# Patient Record
Sex: Male | Born: 1985 | Race: White | Hispanic: No | Marital: Single | Smoking: Never smoker
Health system: Southern US, Community
[De-identification: ages and names within clinical notes are randomized; demographics above are authoritative.]

---

## 2011-11-12 ENCOUNTER — Encounter (HOSPITAL_COMMUNITY): Payer: Self-pay | Admitting: *Deleted

## 2011-11-12 ENCOUNTER — Emergency Department (HOSPITAL_COMMUNITY)
Admission: EM | Admit: 2011-11-12 | Discharge: 2011-11-12 | Disposition: A | Discharge status: Home / Self Care | Payer: BC Managed Care – PPO | Attending: Emergency Medicine | Admitting: Emergency Medicine

## 2011-11-12 DIAGNOSIS — R61 Generalized hyperhidrosis: Secondary | ICD-10-CM | POA: Insufficient documentation

## 2011-11-12 DIAGNOSIS — R002 Palpitations: Secondary | ICD-10-CM

## 2011-11-12 DIAGNOSIS — R0602 Shortness of breath: Secondary | ICD-10-CM | POA: Insufficient documentation

## 2011-11-12 LAB — CBC WITH DIFFERENTIAL/PLATELET
Basophils Relative: 0 % (ref 0–1)
Eosinophils Absolute: 0.1 10*3/uL (ref 0.0–0.7)
Eosinophils Relative: 1 % (ref 0–5)
Lymphocytes Relative: 30 % (ref 12–46)
Lymphs Abs: 2.6 10*3/uL (ref 0.7–4.0)
Monocytes Absolute: 0.3 10*3/uL (ref 0.1–1.0)
Monocytes Relative: 4 % (ref 3–12)
Neutro Abs: 5.7 10*3/uL (ref 1.7–7.7)
Platelets: 212 10*3/uL (ref 150–400)
RBC: 5.93 MIL/uL — ABNORMAL HIGH (ref 4.22–5.81)
RDW: 15.7 % — ABNORMAL HIGH (ref 11.5–15.5)
WBC: 8.7 10*3/uL (ref 4.0–10.5)

## 2011-11-12 LAB — BASIC METABOLIC PANEL
BUN: 23 mg/dL (ref 6–23)
Calcium: 9.4 mg/dL (ref 8.4–10.5)
Creatinine, Ser: 0.92 mg/dL (ref 0.50–1.35)
GFR calc Af Amer: >90 mL/min (ref >90)
GFR calc non Af Amer: >90 mL/min (ref >90)
Glucose, Bld: 95 mg/dL (ref 70–99)

## 2011-11-12 NOTE — ED Notes (Signed)
Pt states he was waken from sleep with heart racing and shortness of breath

## 2011-11-12 NOTE — ED Provider Notes (Signed)
History     CSN: [id number]  Arrival date & time 11/12/11  0415   First MD Initiated Contact with Patient 11/12/11 [phone]      Chief Complaint  Patient presents with  . Palpitations  . Shortness of Breath    (Consider location/radiation/quality/duration/timing/severity/associated sxs/prior treatment) Patient is a 26 y.o. male presenting with palpitations. The history is provided by the patient.  Palpitations  Associated symptoms include diaphoresis. Pertinent negatives include no fever, no chest pain, no chest pressure, no abdominal pain, no nausea, no vomiting, no headaches, no cough and no shortness of breath.  pt say she has been awakened with profuse diaphoresis and a rapid heart beat for a few days.   He denies pain, cough, sob, n/v/d/f/c.  He says his voice has changed but denies st.  He has not had this before.  He drinks coffee occasionally.  He uses no drugs or medicines.  He denies smoking.   No past medical history on file.  History reviewed. No pertinent past surgical history.  History reviewed. No pertinent family history.  History  Substance Use Topics  . Smoking status: Not on file  . Smokeless tobacco: Not on file  . Alcohol Use: Yes      Review of Systems  Constitutional: Positive for diaphoresis. Negative for fever and chills.  HENT: Positive for voice change. Negative for congestion, sore throat, trouble swallowing, neck pain and neck stiffness.   Respiratory: Negative for cough and shortness of breath.   Cardiovascular: Positive for palpitations. Negative for chest pain.  Gastrointestinal: Negative for nausea, vomiting, abdominal pain and diarrhea.  Skin: Negative for pallor and rash.  Neurological: Negative for headaches.  Hematological: Does not bruise/bleed easily.  Psychiatric/Behavioral: Negative for confusion.  All other systems reviewed and are negative.    Allergies  Review of patient's allergies indicates no known allergies.  Home  Medications   Current Outpatient Rx  Name Route Sig Dispense Refill  . ASPIRIN EC 325 MG PO TBEC Oral Take 650 mg by mouth once.      BP 120/70  Pulse 60  Temp 98.7 F (37.1 C) (Oral)  Resp 14  SpO2 100%  Physical Exam  Vitals reviewed. Constitutional: He is oriented to person, place, and time. He appears well-developed and well-nourished. No distress.  HENT:  Head: Normocephalic and atraumatic.  Mouth/Throat: Oropharynx is clear and moist. No oropharyngeal exudate.  Eyes: Conjunctivae and EOM are normal.  Neck: Normal range of motion. Neck supple.  Cardiovascular: Normal rate and intact distal pulses.   No murmur heard. Pulmonary/Chest: Effort normal. No respiratory distress.  Abdominal: Soft. Bowel sounds are normal. There is no tenderness.  Musculoskeletal: Normal range of motion. He exhibits no edema.  Neurological: He is alert and oriented to person, place, and time.  Skin: Skin is warm and dry.  Psychiatric: He has a normal mood and affect. Thought content normal.    ED Course  Procedures (including critical care time) Idiopathic tachycardia and sweating by hx. Nl ecg.  asx except "voice change" . Nl pe.  Will check labs.  Expect that he needs to see cards for monitor.     Labs Reviewed  CBC WITH DIFFERENTIAL  BASIC METABOLIC PANEL   No results found.   No diagnosis found.  ECG Normal sinus rhythm, heart rate 64 beats per minute. Normal axis. RSR prime consistent with right ventricular conduction or RVH. Normal ST and T waves  normal sinus rhythm heartbeat or ischemic changes   MDM  Palpitations. No signs of dysrrhythmia. No electrolyte abnly or anemia.   Normal hr.  No signs severe illness       Cheri Guppy, MD 11/12/11 [phone]

## 2011-11-12 NOTE — ED Notes (Addendum)
Pt states that he was awoken from sleep at 0300 tonight with his heart racing, SOB, shaky, and sweaty. Pt says the symptoms lasted ~ 30 minutes and that this happened on Monday night last week also. Pt denies any night terrors, bad dreams, caffeine intake before bed and any changes to his usual bedtime routine. No hx of heart or respiratory problems.

## 2015-11-15 DIAGNOSIS — Z8349 Family history of other endocrine, nutritional and metabolic diseases: Secondary | ICD-10-CM | POA: Diagnosis not present

## 2015-11-15 DIAGNOSIS — L309 Dermatitis, unspecified: Secondary | ICD-10-CM | POA: Diagnosis not present

## 2015-11-15 DIAGNOSIS — R5383 Other fatigue: Secondary | ICD-10-CM | POA: Diagnosis not present

## 2015-11-15 DIAGNOSIS — R1032 Left lower quadrant pain: Secondary | ICD-10-CM | POA: Diagnosis not present

## 2015-11-22 DIAGNOSIS — R1032 Left lower quadrant pain: Secondary | ICD-10-CM | POA: Diagnosis not present

## 2015-11-29 DIAGNOSIS — R1032 Left lower quadrant pain: Secondary | ICD-10-CM | POA: Diagnosis not present

## 2015-12-04 ENCOUNTER — Other Ambulatory Visit: Payer: Self-pay | Admitting: Surgery

## 2015-12-04 DIAGNOSIS — R1032 Left lower quadrant pain: Secondary | ICD-10-CM

## 2015-12-10 ENCOUNTER — Other Ambulatory Visit: Payer: Self-pay

## 2015-12-13 ENCOUNTER — Ambulatory Visit
Admission: RE | Admit: 2015-12-13 | Discharge: 2015-12-13 | Disposition: A | Discharge status: Home / Self Care | Payer: BLUE CROSS/BLUE SHIELD | Source: Ambulatory Visit | Attending: Surgery | Admitting: Surgery

## 2015-12-13 DIAGNOSIS — R1030 Lower abdominal pain, unspecified: Secondary | ICD-10-CM | POA: Diagnosis not present

## 2015-12-13 DIAGNOSIS — R1032 Left lower quadrant pain: Secondary | ICD-10-CM

## 2015-12-13 MED ORDER — IOPAMIDOL (ISOVUE-300) INJECTION 61%
100.0000 mL | Freq: Once | INTRAVENOUS | Status: AC | PRN
  Administered 2015-12-13: 100 mL via INTRAVENOUS

## 2015-12-20 ENCOUNTER — Other Ambulatory Visit: Payer: Self-pay | Admitting: Surgery

## 2015-12-20 DIAGNOSIS — R1032 Left lower quadrant pain: Secondary | ICD-10-CM

## 2015-12-24 ENCOUNTER — Other Ambulatory Visit: Payer: BLUE CROSS/BLUE SHIELD

## 2015-12-27 ENCOUNTER — Ambulatory Visit
Admission: RE | Admit: 2015-12-27 | Discharge: 2015-12-27 | Disposition: A | Discharge status: Home / Self Care | Payer: BLUE CROSS/BLUE SHIELD | Source: Ambulatory Visit | Attending: Surgery | Admitting: Surgery

## 2015-12-27 DIAGNOSIS — R1032 Left lower quadrant pain: Secondary | ICD-10-CM

## 2015-12-27 DIAGNOSIS — R1030 Lower abdominal pain, unspecified: Secondary | ICD-10-CM | POA: Diagnosis not present

## 2015-12-28 ENCOUNTER — Other Ambulatory Visit: Payer: BLUE CROSS/BLUE SHIELD

## 2016-01-07 DIAGNOSIS — R102 Pelvic and perineal pain: Secondary | ICD-10-CM | POA: Diagnosis not present

## 2016-01-18 DIAGNOSIS — H00034 Abscess of left upper eyelid: Secondary | ICD-10-CM | POA: Diagnosis not present

## 2016-01-19 DIAGNOSIS — H0014 Chalazion left upper eyelid: Secondary | ICD-10-CM | POA: Diagnosis not present

## 2016-02-23 DIAGNOSIS — A630 Anogenital (venereal) warts: Secondary | ICD-10-CM | POA: Diagnosis not present

## 2016-02-28 DIAGNOSIS — R102 Pelvic and perineal pain: Secondary | ICD-10-CM | POA: Diagnosis not present

## 2016-04-05 DIAGNOSIS — D649 Anemia, unspecified: Secondary | ICD-10-CM | POA: Diagnosis not present

## 2016-04-05 DIAGNOSIS — A630 Anogenital (venereal) warts: Secondary | ICD-10-CM | POA: Diagnosis not present

## 2016-08-11 DIAGNOSIS — R42 Dizziness and giddiness: Secondary | ICD-10-CM | POA: Diagnosis not present

## 2016-08-11 DIAGNOSIS — D649 Anemia, unspecified: Secondary | ICD-10-CM | POA: Diagnosis not present

## 2016-08-11 DIAGNOSIS — R238 Other skin changes: Secondary | ICD-10-CM | POA: Diagnosis not present

## 2016-08-11 DIAGNOSIS — H538 Other visual disturbances: Secondary | ICD-10-CM | POA: Diagnosis not present

## 2016-10-15 IMAGING — CT CT PELVIS W/ CM
1 series · 15 of 32 positions shown, 19 images · IV contrast (APPLIED)
Comparison: None.

CLINICAL DATA: Left groin pain.

EXAM:
CT PELVIS WITH CONTRAST
TECHNIQUE: Multidetector CT imaging of the pelvis was performed using the
standard protocol following the bolus administration of intravenous
contrast.
CONTRAST:  100mL 9LKRV1-1PP IOPAMIDOL (9LKRV1-1PP) INJECTION 61%

[Series 2: routine pelvis w/cm · axial · 0.66mm/px · z∈[-422,-142]mm · 15 of 63 slices shown, 19 images]
[im 5/63  soft-tissue]
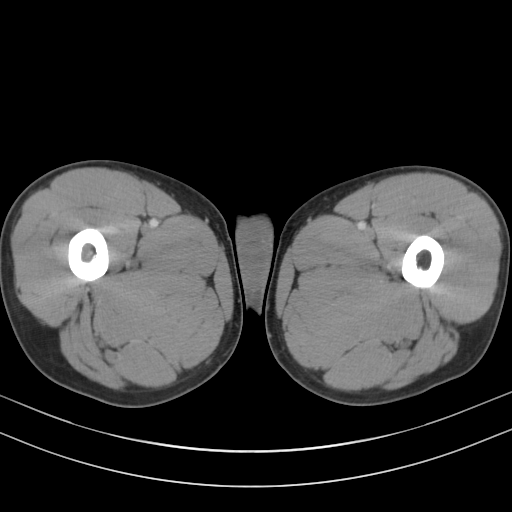
[im 5/63  bone]
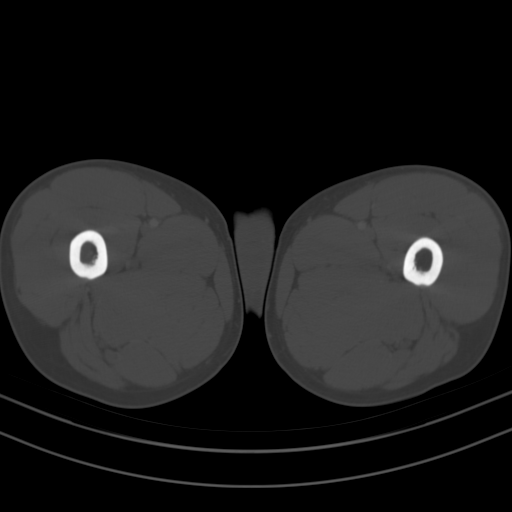
[im 9/63  soft-tissue]
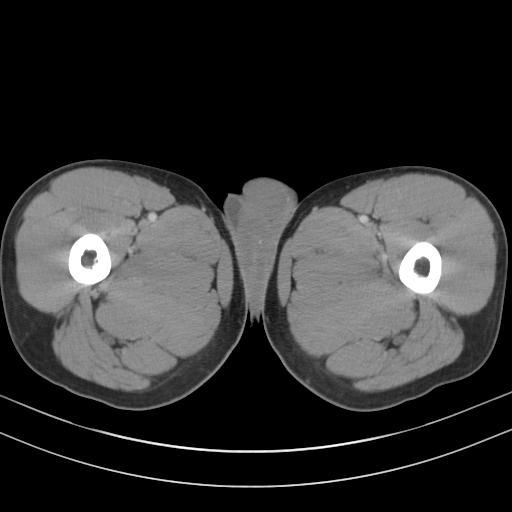
[im 13/63  soft-tissue]
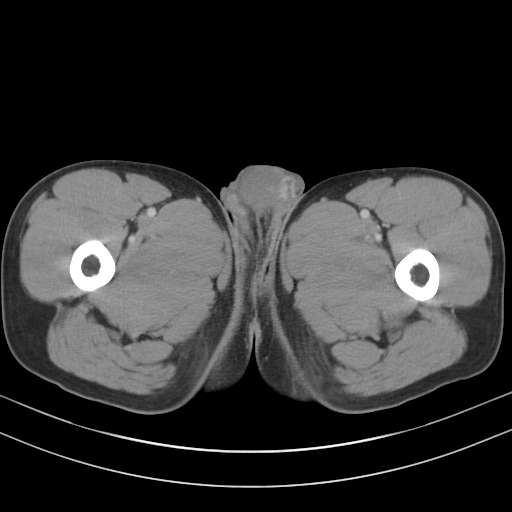
[im 19/63  soft-tissue]
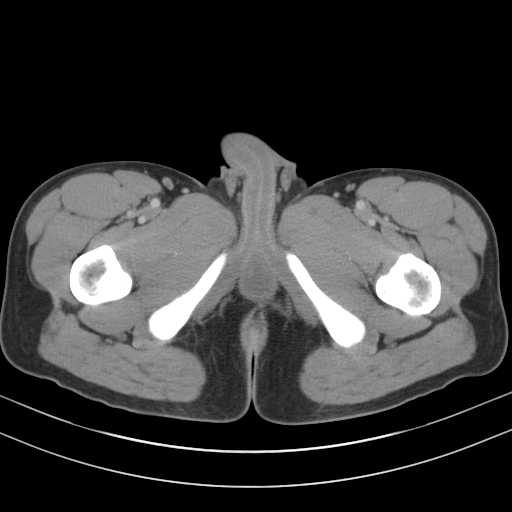
[im 23/63  soft-tissue]
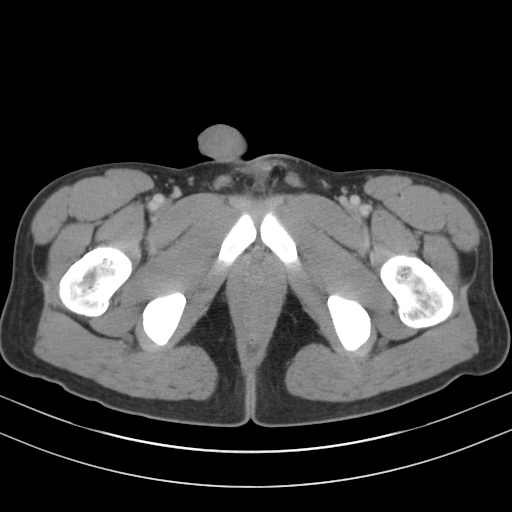
[im 27/63  soft-tissue]
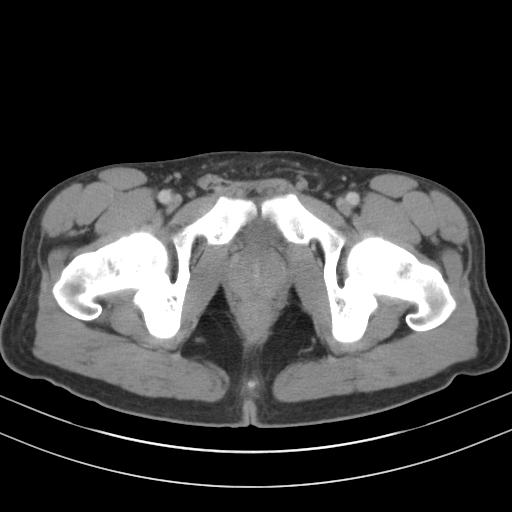
[im 33/63  soft-tissue]
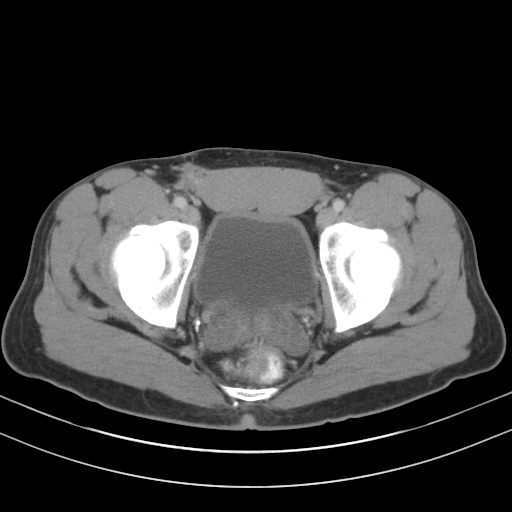
[im 37/63  soft-tissue]
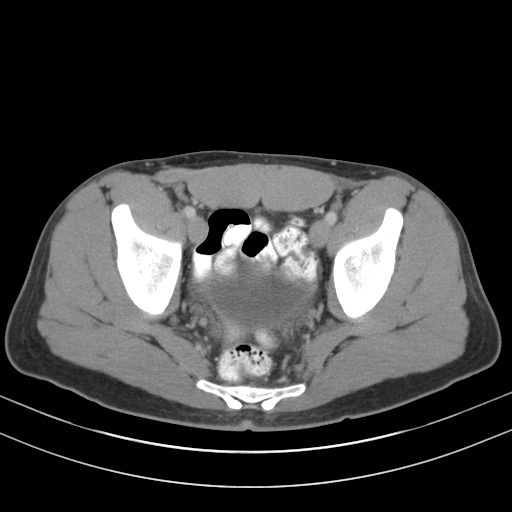
[im 41/63  soft-tissue]
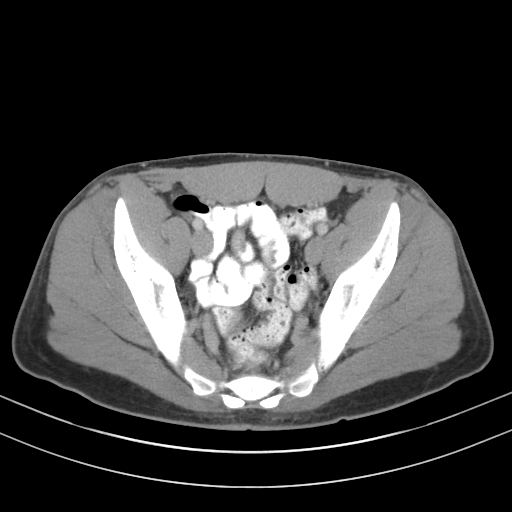
[im 41/63  bone]
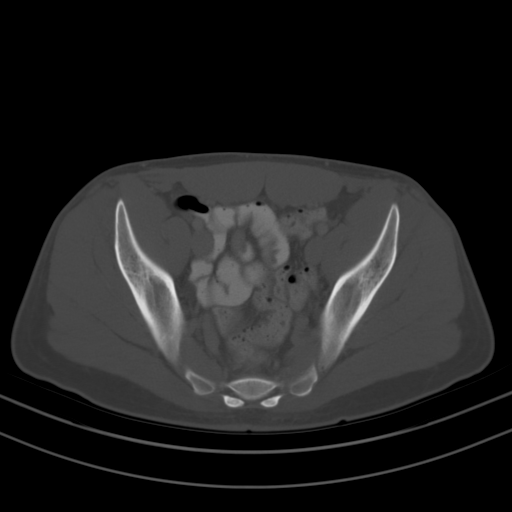
[im 45/63  soft-tissue]
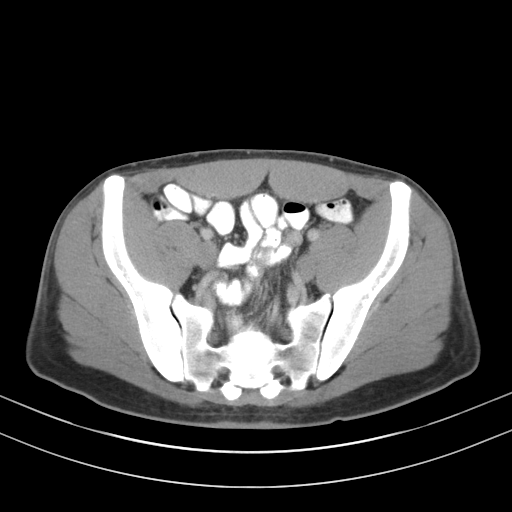
[im 51/63  soft-tissue]
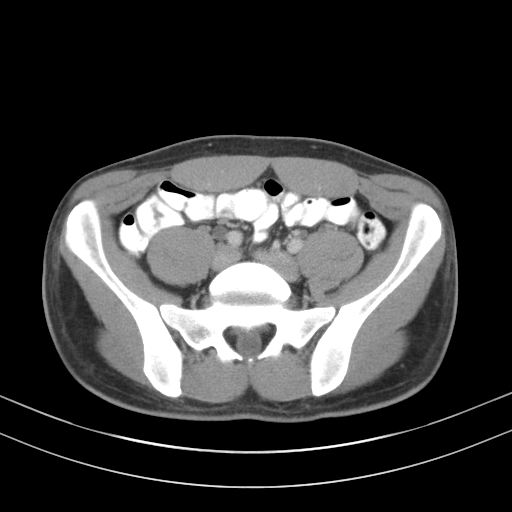
[im 55/63  soft-tissue]
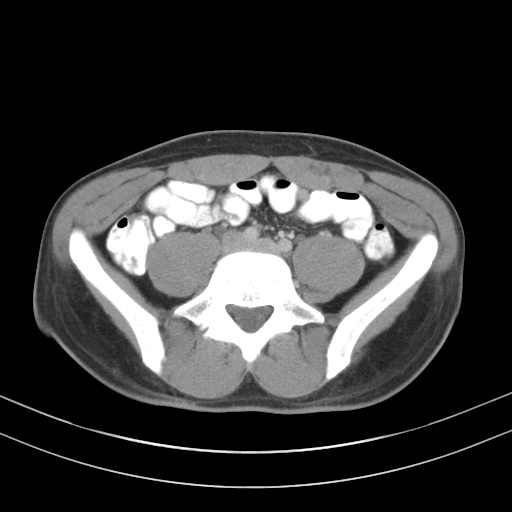
[im 55/63  lung]
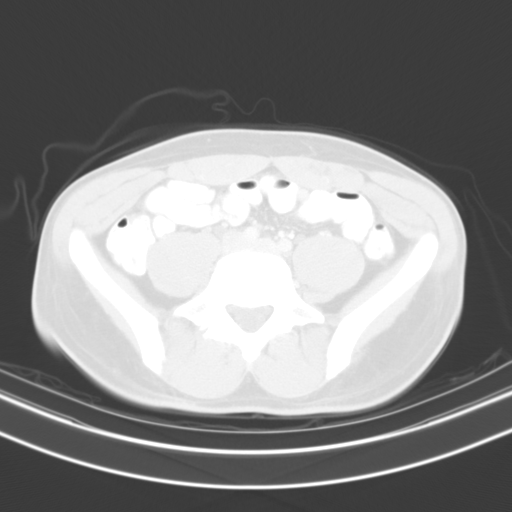
[im 57/63  lung]
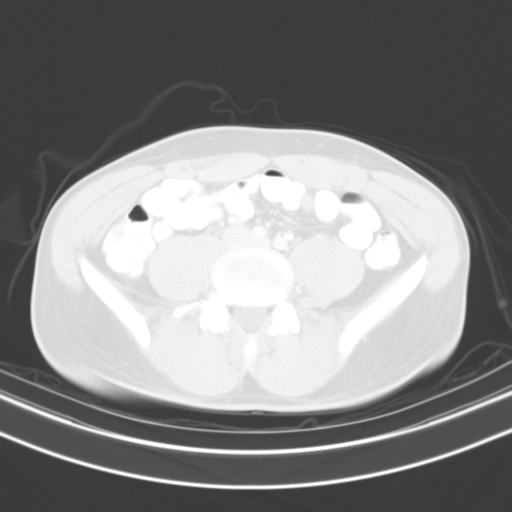
[im 59/63  soft-tissue]
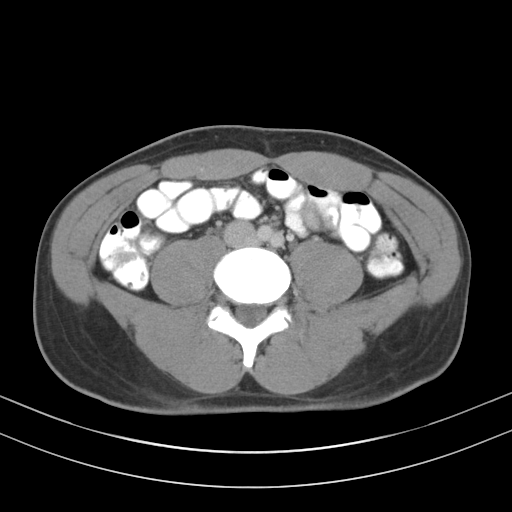
[im 59/63  lung]
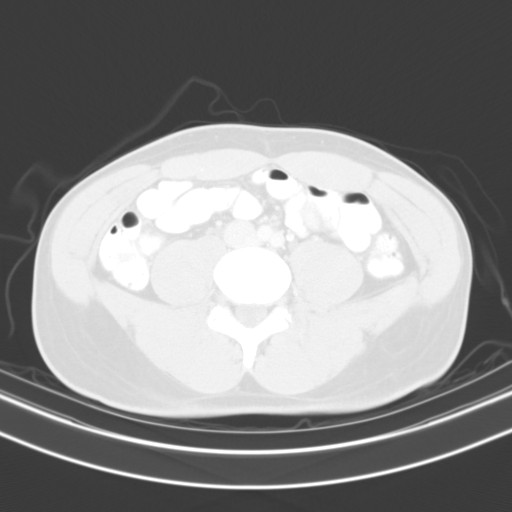
[im 61/63  lung]
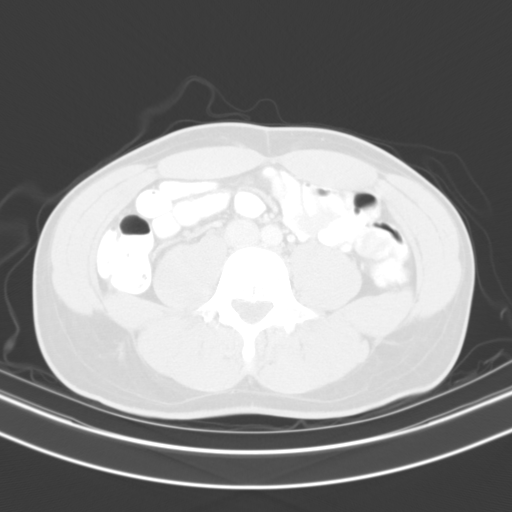

[15 of 32 positions shown; findings below may reference images not displayed]

FINDINGS: No free air or free fluid. Mild prominence of the bladder wall
anteriorly may be due to poor distention. There is no adjacent
stranding to suggest active inflammation. No masses are seen in the
bladder on limited images. The prostate and seminal vesicles are
normal. The patient is status post a right inguinal hernia repair
with no evidence of recurrence. No significant left inguinal hernia.
No adenopathy or mass in the pelvis. The appendix is not seen but
there is no secondary evidence of appendicitis. Pelvic loops of
bowel are within normal limits.

Mildly prominent vessels in the scrotum bilaterally, left greater
than right. No significant bony abnormalities.
IMPRESSION: 1. Prominent vessels in the scrotum are nonspecific and poorly
evaluated with CT imaging but varicoceles are not excluded. An
ultrasound could better evaluate.
2. No other cause for left groin pain identified.

## 2016-10-29 IMAGING — US US SCROTUM
1 series · 13 of 25 positions shown · non-contrast
Comparison: CT [DATE]

CLINICAL DATA: Left inguinal pain since June 2015. Prior right
inguinal hernia repair.

EXAM:
ULTRASOUND OF SCROTUM
SCROTAL DOPPLER
TECHNIQUE: Complete ultrasound examination of the testicles, epididymis, and
other scrotal structures was performed. Doppler interrogation of the
testicles performed.

[Series 1: us scrotum · 0.13mm/px · 13 of 46 slices shown]
[im 1/46]
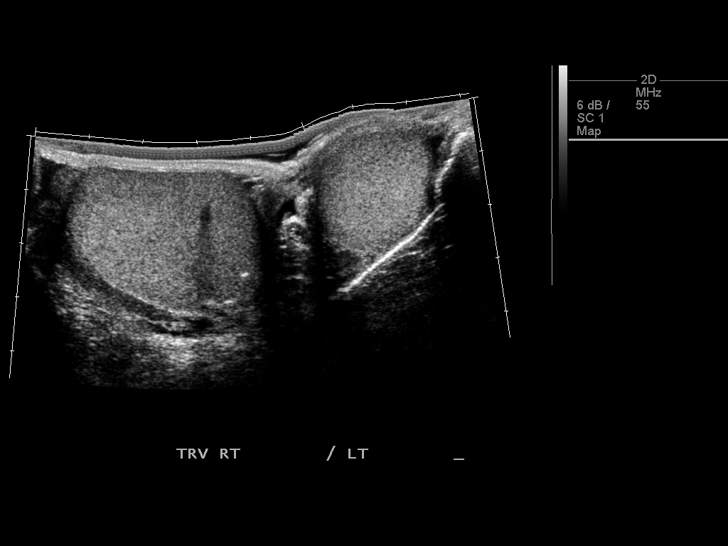
[im 4/46]
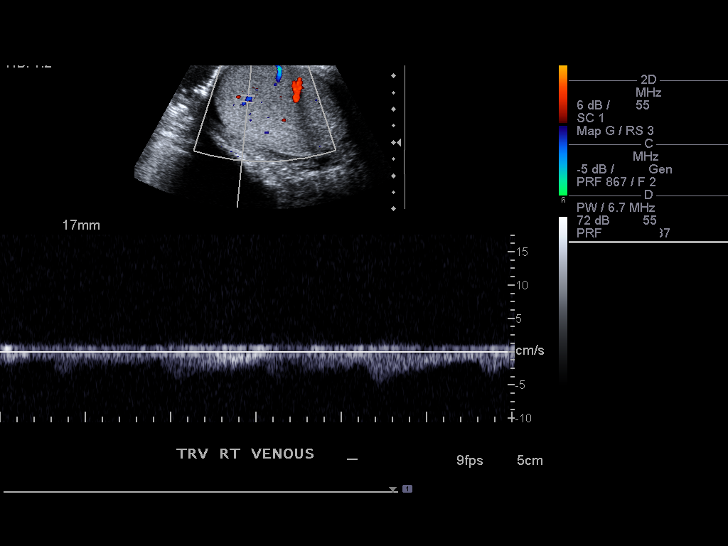
[im 8/46]
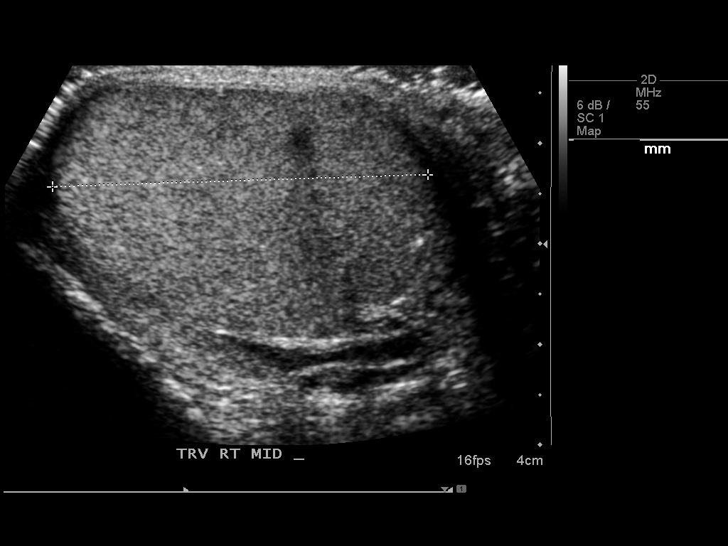
[im 12/46]
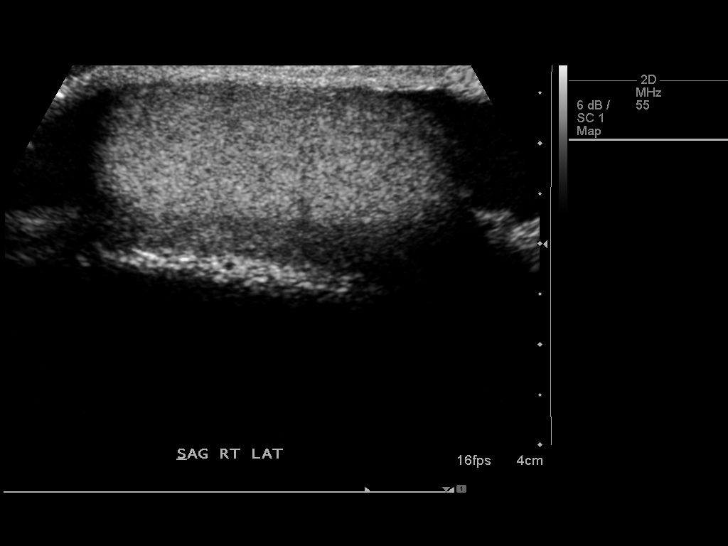
[im 16/46]
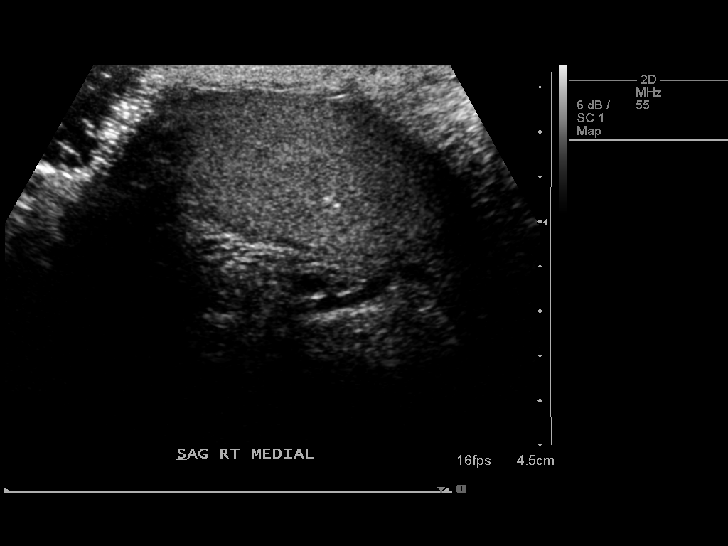
[im 19/46]
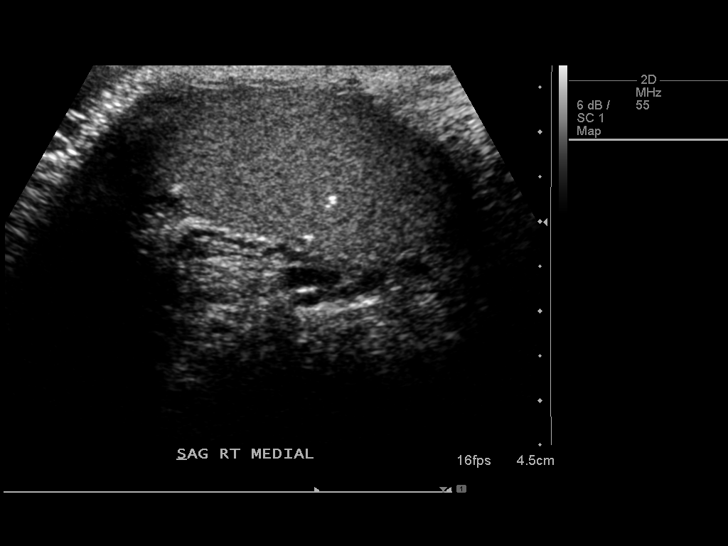
[im 23/46]
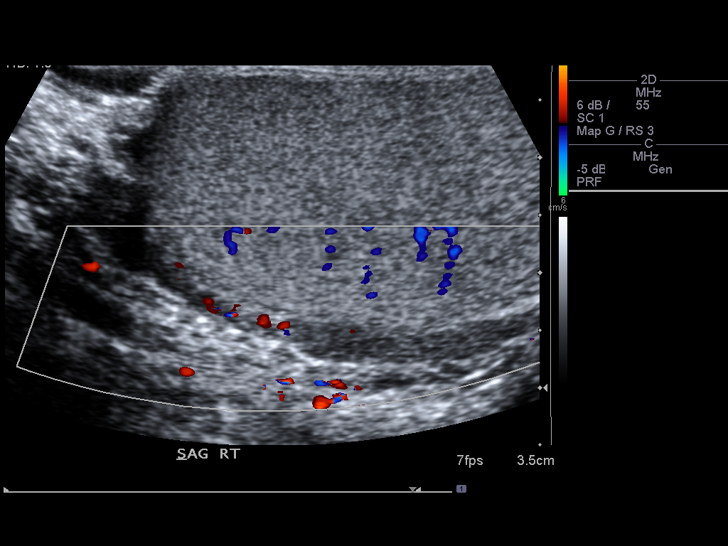
[im 27/46]
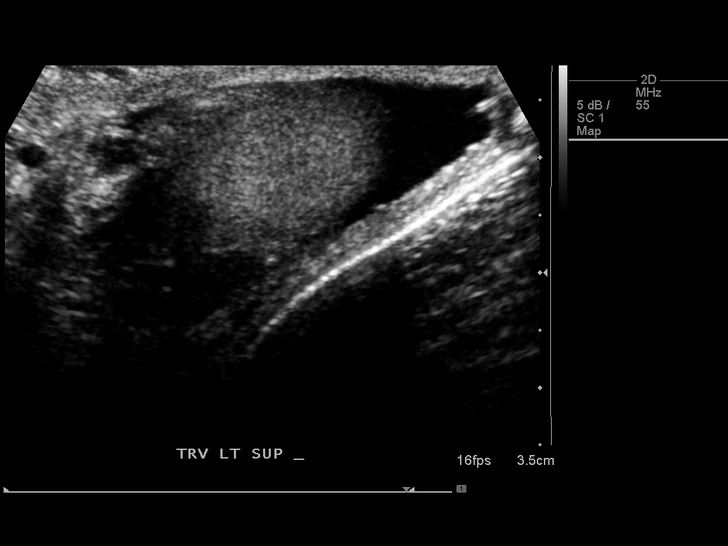
[im 31/46]
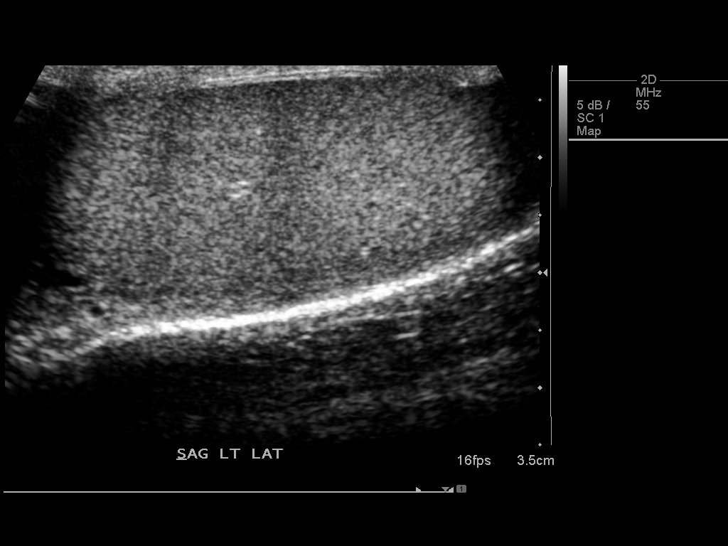
[im 34/46]
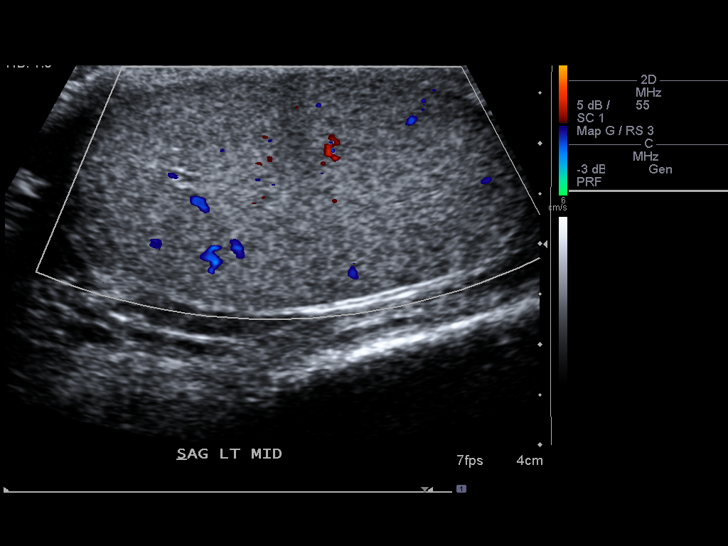
[im 38/46]
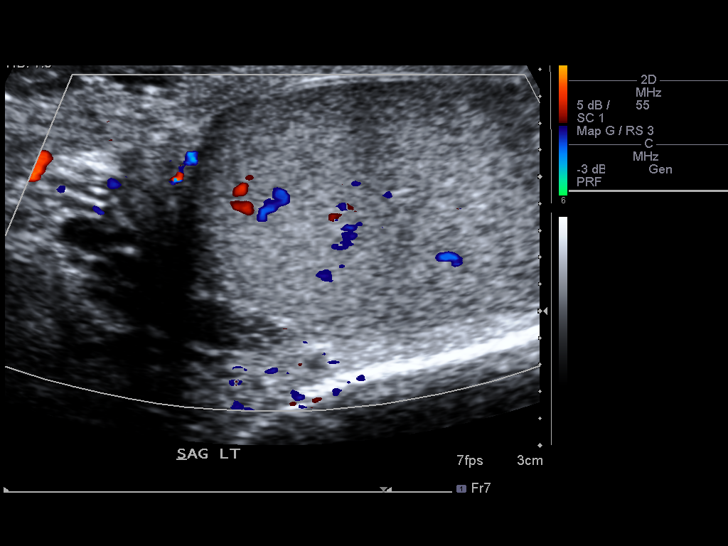
[im 42/46]
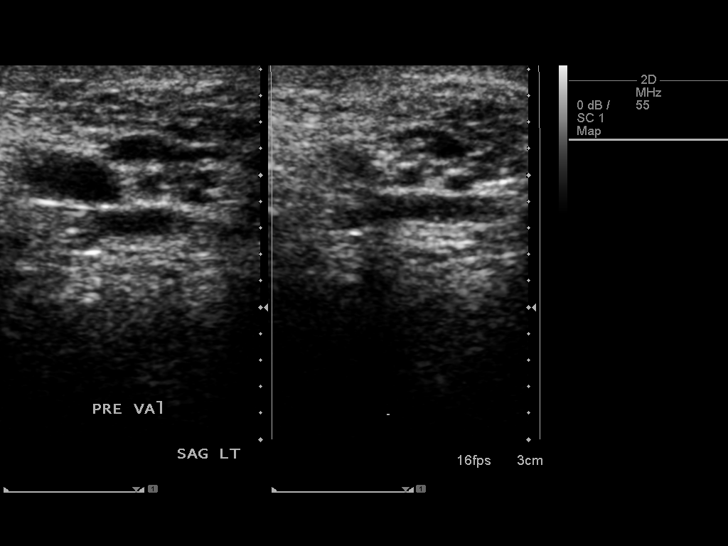
[im 46/46]
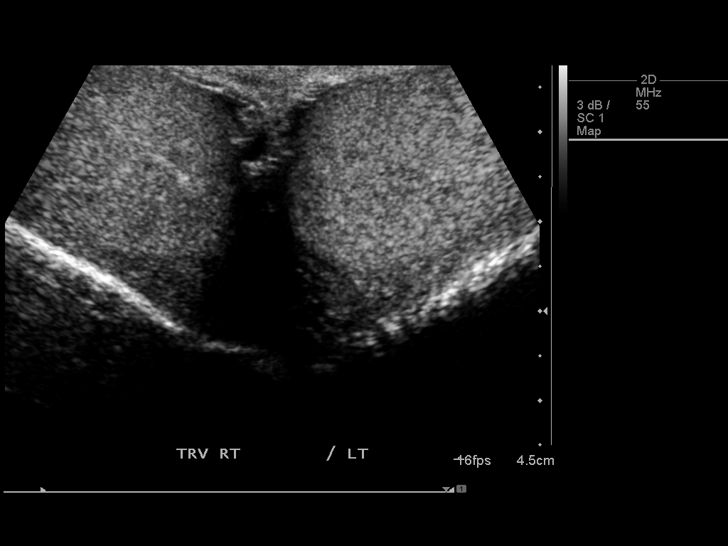

[13 of 25 positions shown; findings below may reference images not displayed]

FINDINGS: Right testicle

Measurements: 5.1 x 2.4 x 3.7 cm. No mass. Several small
microcalcifications.

Left testicle

Measurements: 4.7 x 2.5 x 3.2 cm. No mass or microlithiasis
visualized.

Right epididymis:  Normal in size and appearance.

Left epididymis:  Normal in size and appearance.

Hydrocele:  None visualized.

Varicocele:  None visualized.

Doppler: Doppler of the testicles demonstrates normal low pressure
arterial and venous waveforms bilaterally.
IMPRESSION: Small scattered microcalcifications within the right testicle.
Current literature suggests that testicular microlithiasis is not a
significant independent risk factor for development of testicular
carcinoma, and that follow up imaging is not warranted in the
absence of other risk factors. Monthly testicular self-examination
and annual physical exams are considered appropriate surveillance.
If patient has other risk factors for testicular carcinoma, then
referral to Urology should be considered. (Reference: Lamarche, et
al.: A 5-Year Follow up Study of Asymptomatic Men with Testicular
Microlithiasis. J Urol 3118; [id number].)

Otherwise no abnormality.  Normal blood flow bilaterally.

## 2016-11-24 DIAGNOSIS — Z113 Encounter for screening for infections with a predominantly sexual mode of transmission: Secondary | ICD-10-CM | POA: Diagnosis not present

## 2017-01-08 DIAGNOSIS — J069 Acute upper respiratory infection, unspecified: Secondary | ICD-10-CM | POA: Diagnosis not present

## 2017-07-23 DIAGNOSIS — J020 Streptococcal pharyngitis: Secondary | ICD-10-CM | POA: Diagnosis not present

## 2017-07-23 DIAGNOSIS — J029 Acute pharyngitis, unspecified: Secondary | ICD-10-CM | POA: Diagnosis not present

## 2018-04-04 DIAGNOSIS — J343 Hypertrophy of nasal turbinates: Secondary | ICD-10-CM | POA: Diagnosis not present

## 2018-04-04 DIAGNOSIS — Z7289 Other problems related to lifestyle: Secondary | ICD-10-CM | POA: Diagnosis not present

## 2018-04-04 DIAGNOSIS — J358 Other chronic diseases of tonsils and adenoids: Secondary | ICD-10-CM | POA: Diagnosis not present

## 2018-04-15 DIAGNOSIS — J358 Other chronic diseases of tonsils and adenoids: Secondary | ICD-10-CM | POA: Diagnosis not present

## 2018-04-15 DIAGNOSIS — D649 Anemia, unspecified: Secondary | ICD-10-CM | POA: Diagnosis not present

## 2018-04-15 DIAGNOSIS — R17 Unspecified jaundice: Secondary | ICD-10-CM | POA: Diagnosis not present

## 2020-01-20 DIAGNOSIS — H0289 Other specified disorders of eyelid: Secondary | ICD-10-CM | POA: Diagnosis not present

## 2020-08-02 DIAGNOSIS — R000 Tachycardia, unspecified: Secondary | ICD-10-CM | POA: Diagnosis not present

## 2020-08-02 DIAGNOSIS — Z13220 Encounter for screening for lipoid disorders: Secondary | ICD-10-CM | POA: Diagnosis not present

## 2020-08-02 DIAGNOSIS — D649 Anemia, unspecified: Secondary | ICD-10-CM | POA: Diagnosis not present

## 2020-08-02 DIAGNOSIS — R5383 Other fatigue: Secondary | ICD-10-CM | POA: Diagnosis not present

## 2020-08-09 DIAGNOSIS — D649 Anemia, unspecified: Secondary | ICD-10-CM | POA: Diagnosis not present

## 2020-08-30 DIAGNOSIS — R0683 Snoring: Secondary | ICD-10-CM | POA: Diagnosis not present

## 2020-08-30 DIAGNOSIS — G478 Other sleep disorders: Secondary | ICD-10-CM | POA: Diagnosis not present

## 2020-08-30 DIAGNOSIS — H6123 Impacted cerumen, bilateral: Secondary | ICD-10-CM | POA: Diagnosis not present

## 2020-08-30 DIAGNOSIS — J3489 Other specified disorders of nose and nasal sinuses: Secondary | ICD-10-CM | POA: Diagnosis not present

## 2020-08-30 DIAGNOSIS — R0600 Dyspnea, unspecified: Secondary | ICD-10-CM | POA: Diagnosis not present

## 2020-09-01 ENCOUNTER — Ambulatory Visit: Payer: BC Managed Care – PPO | Admitting: Internal Medicine

## 2020-09-06 DIAGNOSIS — G478 Other sleep disorders: Secondary | ICD-10-CM | POA: Diagnosis not present

## 2020-09-08 DIAGNOSIS — G4733 Obstructive sleep apnea (adult) (pediatric): Secondary | ICD-10-CM | POA: Diagnosis not present

## 2020-10-04 DIAGNOSIS — A630 Anogenital (venereal) warts: Secondary | ICD-10-CM | POA: Diagnosis not present

## 2020-10-11 ENCOUNTER — Ambulatory Visit: Payer: BC Managed Care – PPO | Admitting: Cardiovascular Disease

## 2020-10-13 ENCOUNTER — Encounter: Payer: Self-pay | Admitting: Cardiovascular Disease

## 2020-10-18 ENCOUNTER — Ambulatory Visit: Payer: BC Managed Care – PPO | Admitting: Cardiology

## 2020-10-18 ENCOUNTER — Ambulatory Visit (INDEPENDENT_AMBULATORY_CARE_PROVIDER_SITE_OTHER): Payer: BC Managed Care – PPO

## 2020-10-18 ENCOUNTER — Other Ambulatory Visit: Payer: Self-pay

## 2020-10-18 ENCOUNTER — Encounter: Payer: Self-pay | Admitting: Cardiology

## 2020-10-18 VITALS — BP 100/62 | HR 58 | Ht 68.0 in | Wt 142.0 lb

## 2020-10-18 DIAGNOSIS — R002 Palpitations: Secondary | ICD-10-CM | POA: Diagnosis not present

## 2020-10-18 DIAGNOSIS — G4733 Obstructive sleep apnea (adult) (pediatric): Secondary | ICD-10-CM | POA: Diagnosis not present

## 2020-10-18 NOTE — Patient Instructions (Signed)
Medication Instructions:  Your physician recommends that you continue on your current medications as directed. Please refer to the Current Medication list given to you today.  *If you need a refill on your cardiac medications before your next appointment, please call your pharmacy*   Lab Work: None ordered If you have labs (blood work) drawn today and your tests are completely normal, you will receive your results only by: MyChart Message (if you have MyChart) OR A paper copy in the mail If you have any lab test that is abnormal or we need to change your treatment, we will call you to review the results.   Testing/Procedures:  Your physician has recommended that you wear a Zio XT monitor for 2 weeks   This monitor is a medical device that records the heart's electrical activity. Doctors most often use these monitors to diagnose arrhythmias. Arrhythmias are problems with the speed or rhythm of the heartbeat. The monitor is a small device applied to your chest. You can wear one while you do your normal daily activities. While wearing this monitor if you have any symptoms to push the button and record what you felt. Once you have worn this monitor for the period of time provider prescribed (Usually 14 days), you will return the monitor device in the postage paid box. Once it is returned they will download the data collected and provide us with a report which the provider will then review and we will call you with those results. Important tips:  Avoid showering during the first 24 hours of wearing the monitor. Avoid excessive sweating to help maximize wear time. Do not submerge the device, no hot tubs, and no swimming pools. Keep any lotions or oils away from the patch. After 24 hours you may shower with the patch on. Take brief showers with your back facing the shower head.  Do not remove patch once it has been placed because that will interrupt data and decrease adhesive wear time. Push the  button when you have any symptoms and write down what you were feeling. Once you have completed wearing your monitor, remove and place into box which has postage paid and place in your outgoing mailbox.  If for some reason you have misplaced your box then call our office and we can provide another box and/or mail it off for you.      Follow-Up: At [facility], you and your health needs are our priority.  As part of our continuing mission to provide you with exceptional heart care, we have created designated Provider Care Teams.  These Care Teams include your primary Cardiologist (physician) and Advanced Practice Providers (APPs -  Physician Assistants and Nurse Practitioners) who all work together to provide you with the care you need, when you need it.  We recommend signing up for the patient portal called "MyChart".  Sign up information is provided on this After Visit Summary.  MyChart is used to connect with patients for Virtual Visits (Telemedicine).  Patients are able to view lab/test results, encounter notes, upcoming appointments, etc.  Non-urgent messages can be sent to your provider as well.   To learn more about what you can do with MyChart, go to [email or web address].    Your next appointment:   6 week(s)  The format for your next appointment:   In Person  Provider:   Brian Agbor-Etang, MD   Other Instructions   

## 2020-10-18 NOTE — Progress Notes (Signed)
Cardiology Office Note:    Date:  10/18/2020   ID:  Matthew Murray, DOB 1985/09/25, MRN [id number]  PCP:  Matthew Blamer, MD   [facility] Providers Cardiologist:  Debbe Odea, MD     Referring MD: Adrienne Mocha, PA   Chief Complaint  Patient presents with   New Patient (Initial Visit)    Referred by PCP for episodes of heart racing at night waking him from sleep; bradycardia. Meds reviewed verbally with patient.    Matthew Murray is a 35 y.o. male who is being seen today for the evaluation of palpitations at the request of Adrienne Mocha, [place].   History of Present Illness:    Matthew Murray is a 35 y.o. male with a hx of mild OSA who presents due to palpitations.  Patient states having symptoms of fast heart rates, typically when he wakes up for years now.  Symptoms are not associated with dizziness, symptoms typically last for few seconds and then go away.  He had a work-up/sleep study and was diagnosed with mild OSA.  He was told to sleep on his side, following up with sleep specialist where a mouth device is being considered.  He denies any history of heart disease, denies edema, shortness of breath, chest pain.  Palpitations occur maybe once a month now, overall improved since sleeping on his side.  History reviewed. No pertinent past medical history.  History reviewed. No pertinent surgical history.  Current Medications: No outpatient medications have been marked as taking for the 10/18/20 encounter (Office Visit) with Debbe Odea, MD.     Allergies:   Patient has no known allergies.   Social History   Socioeconomic History   Marital status: Single    Spouse name: Not on file   Number of children: Not on file   Years of education: Not on file   Highest education level: Not on file  Occupational History   Not on file  Tobacco Use   Smoking status: Never   Smokeless tobacco: Never  Vaping Use   Vaping Use: Never used  Substance and Sexual  Activity   Alcohol use: Yes   Drug use: No   Sexual activity: Not on file  Other Topics Concern   Not on file  Social History Narrative   Not on file   Social Determinants of Health   Financial Resource Strain: Not on file  Food Insecurity: Not on file  Transportation Needs: Not on file  Physical Activity: Not on file  Stress: Not on file  Social Connections: Not on file     Family History: The patient's family history is not on file.  ROS:   Please see the history of present illness.     All other systems reviewed and are negative.  EKGs/Labs/Other Studies Reviewed:    The following studies were reviewed today:   EKG:  EKG is  ordered today.  The ekg ordered today demonstrates sinus bradycardia, heart rate 58, otherwise normal ECG.  Recent Labs: No results found for requested labs within last 8760 hours.  Recent Lipid Panel No results found for: CHOL, TRIG, HDL, CHOLHDL, VLDL, LDLCALC, LDLDIRECT   Risk Assessment/Calculations:          Physical Exam:    VS:  BP 100/62 (BP Location: Left Arm, Patient Position: Sitting, Cuff Size: Normal)   Pulse (!) 58   Ht 5\' 8"  (1.727 m)   Wt 142 lb (64.4 kg)   SpO2 97%   BMI 21.59  kg/m     Wt Readings from Last 3 Encounters:  10/18/20 142 lb (64.4 kg)     GEN:  Well nourished, well developed in no acute distress HEENT: Normal NECK: No JVD; No carotid bruits LYMPHATICS: No lymphadenopathy CARDIAC: RRR, no murmurs, rubs, gallops RESPIRATORY:  Clear to auscultation without rales, wheezing or rhonchi  ABDOMEN: Soft, non-tender, non-distended MUSCULOSKELETAL:  No edema; No deformity  SKIN: Warm and dry NEUROLOGIC:  Alert and oriented x 3 PSYCHIATRIC:  Normal affect   ASSESSMENT:    1. Palpitations   2. OSA (obstructive sleep apnea)    PLAN:    In order of problems listed above:  Palpitations, no cardiac risk factors, no dizziness.  Placed 2-week cardiac monitor to evaluate any significant arrhythmias. Mild  OSA as per patient.  Keep follow-up appointment with sleep specialist.  Follow-up after cardiac monitor.     Medication Adjustments/Labs and Tests Ordered: Current medicines are reviewed at length with the patient today.  Concerns regarding medicines are outlined above.  Orders Placed This Encounter  Procedures   LONG TERM MONITOR (3-14 DAYS)   EKG 12-Lead    No orders of the defined types were placed in this encounter.   Patient Instructions  Medication Instructions:   Your physician recommends that you continue on your current medications as directed. Please refer to the Current Medication list given to you today.  *If you need a refill on your cardiac medications before your next appointment, please call your pharmacy*   Lab Work: None ordered If you have labs (blood work) drawn today and your tests are completely normal, you will receive your results only by: MyChart Message (if you have MyChart) OR A paper copy in the mail If you have any lab test that is abnormal or we need to change your treatment, we will call you to review the results.   Testing/Procedures:   Your physician has recommended that you wear a Zio XT monitor for 2 weeks.   This monitor is a medical device that records the heart's electrical activity. Doctors most often use these monitors to diagnose arrhythmias. Arrhythmias are problems with the speed or rhythm of the heartbeat. The monitor is a small device applied to your chest. You can wear one while you do your normal daily activities. While wearing this monitor if you have any symptoms to push the button and record what you felt. Once you have worn this monitor for the period of time provider prescribed (Usually 14 days), you will return the monitor device in the postage paid box. Once it is returned they will download the data collected and provide [place] with a report which the provider will then review and we will call you with those results. Important  tips:  Avoid showering during the first 24 hours of wearing the monitor. Avoid excessive sweating to help maximize wear time. Do not submerge the device, no hot tubs, and no swimming pools. Keep any lotions or oils away from the patch. After 24 hours you may shower with the patch on. Take brief showers with your back facing the shower head.  Do not remove patch once it has been placed because that will interrupt data and decrease adhesive wear time. Push the button when you have any symptoms and write down what you were feeling. Once you have completed wearing your monitor, remove and place into box which has postage paid and place in your outgoing mailbox.  If for some reason you have misplaced your  box then call our office and we can provide another box and/or mail it off for you.    Follow-Up: At [facility], you and your health needs are our priority.  As part of our continuing mission to provide you with exceptional heart care, we have created designated Provider Care Teams.  These Care Teams include your primary Cardiologist (physician) and Advanced Practice Providers (APPs -  Physician Assistants and Nurse Practitioners) who all work together to provide you with the care you need, when you need it.  We recommend signing up for the patient portal called "MyChart".  Sign up information is provided on this After Visit Summary.  MyChart is used to connect with patients for Virtual Visits (Telemedicine).  Patients are able to view lab/test results, encounter notes, upcoming appointments, etc.  Non-urgent messages can be sent to your provider as well.   To learn more about what you can do with MyChart, go to [email or web address].    Your next appointment:   6 week(s)  The format for your next appointment:   In Person  Provider:   Debbe Odea, MD   Other Instructions   Signed, Debbe Odea, MD  10/18/2020 5:04 PM    [facility]

## 2020-10-25 DIAGNOSIS — R002 Palpitations: Secondary | ICD-10-CM | POA: Diagnosis not present

## 2020-10-29 ENCOUNTER — Telehealth: Payer: Self-pay

## 2020-10-29 NOTE — Telephone Encounter (Signed)
Called patient and left a VM requesting a call back to give the following result note:  Normal cardiac monitor, no arrhythmias noted to explain etiology of fastheartbeats or palpitations.

## 2020-11-11 NOTE — Telephone Encounter (Signed)
Matthew Murray, [place]  11/11/2020  2:51 PM EDT Back to Top     Called number provided.  Person that answered stated she would have him return the call.

## 2020-11-19 NOTE — Telephone Encounter (Signed)
Tried to call a 3rd time. Sending normal result letter to home address.

## 2020-12-20 ENCOUNTER — Ambulatory Visit: Payer: BC Managed Care – PPO | Admitting: Cardiology

## 2021-02-21 DIAGNOSIS — Z719 Counseling, unspecified: Secondary | ICD-10-CM | POA: Diagnosis not present

## 2021-06-06 DIAGNOSIS — B360 Pityriasis versicolor: Secondary | ICD-10-CM | POA: Diagnosis not present

## 2021-06-06 DIAGNOSIS — A630 Anogenital (venereal) warts: Secondary | ICD-10-CM | POA: Diagnosis not present

## 2021-06-06 DIAGNOSIS — D509 Iron deficiency anemia, unspecified: Secondary | ICD-10-CM | POA: Diagnosis not present

## 2021-09-05 DIAGNOSIS — M79675 Pain in left toe(s): Secondary | ICD-10-CM | POA: Diagnosis not present

## 2021-09-05 DIAGNOSIS — S76212A Strain of adductor muscle, fascia and tendon of left thigh, initial encounter: Secondary | ICD-10-CM | POA: Diagnosis not present

## 2021-11-14 ENCOUNTER — Ambulatory Visit: Payer: BC Managed Care – PPO | Admitting: Podiatry

## 2021-11-14 ENCOUNTER — Encounter: Payer: Self-pay | Admitting: Podiatry

## 2021-11-14 ENCOUNTER — Ambulatory Visit (INDEPENDENT_AMBULATORY_CARE_PROVIDER_SITE_OTHER): Payer: BC Managed Care – PPO

## 2021-11-14 DIAGNOSIS — M779 Enthesopathy, unspecified: Secondary | ICD-10-CM | POA: Diagnosis not present

## 2021-11-14 DIAGNOSIS — M79675 Pain in left toe(s): Secondary | ICD-10-CM | POA: Diagnosis not present

## 2021-11-14 DIAGNOSIS — M7752 Other enthesopathy of left foot: Secondary | ICD-10-CM

## 2021-11-14 DIAGNOSIS — M109 Gout, unspecified: Secondary | ICD-10-CM | POA: Diagnosis not present

## 2021-11-14 NOTE — Patient Instructions (Signed)
Gout  Gout is painful swelling of your joints. Gout is a type of arthritis. It is caused by having too much uric acid in your body. Uric acid is a chemical that is made when your body breaks down substances called purines. If your body has too much uric acid, sharp crystals can form and build up in your joints. This causes pain and swelling. Gout attacks can happen quickly and be very painful (acute gout). Over time, the attacks can affect more joints and happen more often (chronic gout). What are the causes? Gout is caused by too much uric acid in your blood. This can happen because: Your kidneys do not remove enough uric acid from your blood. Your body makes too much uric acid. You eat too many foods that are high in purines. These foods include organ meats, some seafood, and beer. Trauma or stress can bring on an attack. What increases the risk? Having a family history of gout. Being male and middle-aged. Being male and having gone through menopause. Having an organ transplant. Taking certain medicines. Having certain conditions, such as: Being very overweight (obese). Lead poisoning. Kidney disease. A skin condition called psoriasis. Other risks include: Losing weight too quickly. Not having enough water in the body (being dehydrated). Drinking alcohol, especially beer. Drinking beverages that are sweetened with a type of sugar called fructose. What are the signs or symptoms? An attack of acute gout often starts at night and usually happens in just one joint. The most common place is the big toe. Other joints that may be affected include joints of the feet, ankle, knee, fingers, wrist, or elbow. Symptoms may include: Very bad pain. Warmth. Swelling. Stiffness. Tenderness. The affected joint may be very painful to touch. Shiny, red, or purple skin. Chills and fever. Chronic gout may cause symptoms more often. More joints may be involved. You may also have white or yellow lumps  (tophi) on your hands or feet or in other areas near your joints. How is this treated? Treatment for an acute attack may include medicines for pain and swelling, such as: NSAIDs, such as ibuprofen. Steroids taken by mouth or injected into a joint. Colchicine. This can be given by mouth or through an IV tube. Treatment to prevent future attacks may include: Taking small doses of NSAIDs or colchicine daily. Using a medicine that reduces uric acid levels in your blood, such as allopurinol. Making changes to your diet. You may need to see a food expert (dietitian) about what to eat and drink to prevent gout. Follow these instructions at home: During a gout attack  If told, put ice on the painful area. To do this: Put ice in a plastic bag. Place a towel between your skin and the bag. Leave the ice on for 20 minutes, 2-3 times a day. Take off the ice if your skin turns bright red. This is very important. If you cannot feel pain, heat, or cold, you have a greater risk of damage to the area. Raise the painful joint above the level of your heart as often as you can. Rest the joint as much as possible. If the joint is in your leg, you may be given crutches. Follow instructions from your doctor about what you cannot eat or drink. Avoiding future gout attacks Eat a low-purine diet. Avoid foods and drinks such as: Liver. Kidney. Anchovies. Asparagus. Herring. Mushrooms. Mussels. Beer. Stay at a healthy weight. If you want to lose weight, talk with your doctor. Do not   lose weight too fast. Start or continue an exercise plan as told by your doctor. Eating and drinking Avoid drinks sweetened by fructose. Drink enough fluids to keep your pee (urine) pale yellow. If you drink alcohol: Limit how much you have to: 0-1 drink a day for women who are not pregnant. 0-2 drinks a day for men. Know how much alcohol is in a drink. In the [place] one drink equals one 12 oz bottle of beer (355 mL), one 5 oz  glass of wine (148 mL), or one 1 oz glass of hard liquor (44 mL). General instructions Take over-the-counter and prescription medicines only as told by your doctor. Ask your doctor if you should avoid driving or using machines while you are taking your medicine. Return to your normal activities when your doctor says that it is safe. Keep all follow-up visits. Where to find more information National Institutes of Health: www.niams.nih.gov Contact a doctor if: You have another gout attack. You still have symptoms of a gout attack after 10 days of treatment. You have problems (side effects) because of your medicines. You have chills or a fever. You have burning pain when you pee (urinate). You have pain in your lower back or belly. Get help right away if: You have very bad pain. Your pain cannot be controlled. You cannot pee. Summary Gout is painful swelling of the joints. The most common site of pain is the big toe, but it can affect other joints. Medicines and avoiding some foods can help to prevent and treat gout attacks. This information is not intended to replace advice given to you by your health care provider. Make sure you discuss any questions you have with your health care provider. Document Revised: 01/19/2021 Document Reviewed: 01/19/2021 Elsevier Patient Education  2023 [employer]  

## 2021-11-16 NOTE — Progress Notes (Signed)
Subjective:   Patient ID: Matthew Murray, male   DOB: 35 y.o.   MRN: [id number]   HPI Patient states that he has the discomfort around his big toe joint left its been present for several months and its been sharp with possibility for protrusion of the joint also.  States that he had tentative diagnosis of gout but unsure of this and did have uric acid levels in April but we do not have the records of this.  Patient has a lot of pain when I try to move the joint and does not remember injury does not smoke likes to be active   Review of Systems  All other systems reviewed and are negative.       Objective:  Physical Exam Vitals and nursing note reviewed.  Constitutional:      Appearance: He is well-developed.  Pulmonary:     Effort: Pulmonary effort is normal.  Musculoskeletal:        General: Normal range of motion.  Skin:    General: Skin is warm.  Neurological:     Mental Status: He is alert.     Neurovascular status intact muscle strength was found to be adequate range of motion adequate.  Patient does have some restriction of motion of the first MPJ left no crepitus of the joint but it is painful with movement and there is inflammation on both the lateral side dorsal portion of the joint surface with also moderate protuberance of the deformity first metatarsal head     Assessment:  Structural deformity with inflammatory capsulitis and also possibility for some hallux limitus deformity     Plan:  H&P x-rays reviewed sterile prep and went ahead and injected her periarticular around the first MPJ 3 mg Kenalog 5 mg Xylocaine and advised him on wider shoes rigid bottom shoes and I want to see him back again in 3 weeks to reevaluate.  May have to reorder blood work depending on response  X-rays indicate moderate bunion deformity left also some prominence of the first metatarsal head with some spurring occurring dorsally indicating the very beginnings of possible structural hallux  limitus condition

## 2021-11-24 ENCOUNTER — Other Ambulatory Visit: Payer: Self-pay | Admitting: Podiatry

## 2021-11-24 DIAGNOSIS — M779 Enthesopathy, unspecified: Secondary | ICD-10-CM

## 2021-12-19 ENCOUNTER — Encounter: Payer: Self-pay | Admitting: Podiatry

## 2021-12-19 ENCOUNTER — Ambulatory Visit: Payer: BC Managed Care – PPO | Admitting: Podiatry

## 2021-12-19 DIAGNOSIS — M2022 Hallux rigidus, left foot: Secondary | ICD-10-CM | POA: Diagnosis not present

## 2021-12-19 DIAGNOSIS — M7752 Other enthesopathy of left foot: Secondary | ICD-10-CM | POA: Diagnosis not present

## 2021-12-19 DIAGNOSIS — M2021 Hallux rigidus, right foot: Secondary | ICD-10-CM

## 2021-12-21 NOTE — Progress Notes (Signed)
Subjective:   Patient ID: Matthew Murray, male   DOB: 36 y.o.   MRN: [id number]   HPI Patient states he is doing quite a bit better stated that the medication really helped him and that he is back to doing normal activities   ROS      Objective:  Physical Exam  Neuro vascular status intact with patient who has a combination functional and beginning structural hallux limitus deformity left over right with significant symptoms on the left side that has been calm down with medication     Assessment:  Chronic hallux limitus condition left mostly functional slight structural left     Plan:  H&P reviewed condition recommended long-term orthotics with reverse Morton's extension which was done today and patient will be seen back when ready with castings done today explanation that someday he may require osteotomy surgery left over right

## 2022-01-09 ENCOUNTER — Ambulatory Visit (INDEPENDENT_AMBULATORY_CARE_PROVIDER_SITE_OTHER): Payer: BC Managed Care – PPO

## 2022-01-09 DIAGNOSIS — M7752 Other enthesopathy of left foot: Secondary | ICD-10-CM

## 2022-01-09 NOTE — Progress Notes (Signed)
Patient presents today to pick up custom molded foot orthotics, diagnosed with capsulitis by Dr. Charlsie Merles.   Orthotics were dispensed and fit was satisfactory. Reviewed instructions for break-in and wear. Written instructions given to patient.  Patient will follow up as needed.   [facility] - order # [id number]

## 2022-03-13 DIAGNOSIS — M79672 Pain in left foot: Secondary | ICD-10-CM | POA: Diagnosis not present

## 2022-03-13 DIAGNOSIS — M2022 Hallux rigidus, left foot: Secondary | ICD-10-CM | POA: Diagnosis not present

## 2022-03-13 DIAGNOSIS — M21612 Bunion of left foot: Secondary | ICD-10-CM | POA: Diagnosis not present

## 2022-06-05 DIAGNOSIS — Z113 Encounter for screening for infections with a predominantly sexual mode of transmission: Secondary | ICD-10-CM | POA: Diagnosis not present

## 2022-06-16 ENCOUNTER — Other Ambulatory Visit: Payer: Self-pay | Admitting: Otolaryngology

## 2022-06-16 DIAGNOSIS — E041 Nontoxic single thyroid nodule: Secondary | ICD-10-CM

## 2022-06-19 DIAGNOSIS — E041 Nontoxic single thyroid nodule: Secondary | ICD-10-CM | POA: Diagnosis not present

## 2022-06-26 ENCOUNTER — Ambulatory Visit
Admission: RE | Admit: 2022-06-26 | Discharge: 2022-06-26 | Disposition: A | Discharge status: Home / Self Care | Payer: BC Managed Care – PPO | Source: Ambulatory Visit | Attending: Otolaryngology | Admitting: Otolaryngology

## 2022-06-26 DIAGNOSIS — E041 Nontoxic single thyroid nodule: Secondary | ICD-10-CM | POA: Diagnosis not present

## 2023-02-05 DIAGNOSIS — L739 Follicular disorder, unspecified: Secondary | ICD-10-CM | POA: Diagnosis not present

## 2024-05-09 ENCOUNTER — Ambulatory Visit

## 2024-05-12 ENCOUNTER — Encounter: Payer: Self-pay | Admitting: Podiatry

## 2024-05-12 ENCOUNTER — Ambulatory Visit

## 2024-05-12 VITALS — Ht 68.0 in | Wt 142.0 lb

## 2024-05-12 DIAGNOSIS — M109 Gout, unspecified: Secondary | ICD-10-CM

## 2024-05-14 NOTE — Progress Notes (Signed)
" ° °  Chief Complaint  Patient presents with   Gout    Patient is here for possible gout of left hall.ox was very red and swollen has somewhat gone down HX of Capsulitis of metatarsophalangeal (MTP) joint of left foot      HPI: 39 y.o. malepresenting for evaluation of left great toe pain.  Overall the pain has improved significantly since making the appointment.  Idiopathic onset.  No history of injury.  History reviewed. No pertinent past medical history.  History reviewed. No pertinent surgical history.  Allergies[1]   Physical Exam: General: The patient is alert and oriented x3 in no acute distress.  Dermatology: Skin is warm, dry and supple bilateral lower extremities.   Vascular: Palpable pedal pulses bilaterally. Capillary refill within normal limits.  Mild erythema and edema noted of the great toe of the left foot  Neurological: Grossly intact via light touch  Musculoskeletal Exam: No pedal deformities noted.  Mild tenderness throughout palpation of the foot  Radiographic Exam LT foot 05/12/2024:  Normal osseous mineralization. Joint spaces preserved.  No fractures or irregularities noted.  Moderate hallux valgus deformity noted with increased intermetatarsal angle impression: Moderate hallux valgus deformity  Assessment/Plan of Care: 1.  Acute gout left great toe  - Patient evaluated.  X-rays reviewed -Today we discussed the pathology and etiology of gout.  Fortunately the patient's episode has mostly resolved and is very tolerable at the moment -Recommend a diet low in purines -Recommend conservative management for now.  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 Thresa EMERSON Sar, DPM    [address] [place], [place] [address]                Office [phone]  Fax [phone]        [1] No Known Allergies  "
